# Patient Record
Sex: Male | Born: 1985 | Race: Black or African American | Hispanic: No | Marital: Single | Smoking: Never smoker
Health system: Southern US, Community
[De-identification: ages and names within clinical notes are randomized; demographics above are authoritative.]

## PROBLEM LIST (undated history)

## (undated) DIAGNOSIS — I10 Essential (primary) hypertension: Secondary | ICD-10-CM

---

## 2015-10-09 ENCOUNTER — Emergency Department (HOSPITAL_COMMUNITY): Payer: BLUE CROSS/BLUE SHIELD

## 2015-10-09 ENCOUNTER — Emergency Department (HOSPITAL_COMMUNITY)
Admission: EM | Admit: 2015-10-09 | Discharge: 2015-10-09 | Disposition: A | Discharge status: Home / Self Care | Payer: BLUE CROSS/BLUE SHIELD | Attending: Emergency Medicine | Admitting: Emergency Medicine

## 2015-10-09 ENCOUNTER — Encounter (HOSPITAL_COMMUNITY): Payer: Self-pay

## 2015-10-09 DIAGNOSIS — S76192A Other specified injury of left quadriceps muscle, fascia and tendon, initial encounter: Secondary | ICD-10-CM | POA: Diagnosis not present

## 2015-10-09 DIAGNOSIS — Y9344 Activity, trampolining: Secondary | ICD-10-CM | POA: Insufficient documentation

## 2015-10-09 DIAGNOSIS — S76112A Strain of left quadriceps muscle, fascia and tendon, initial encounter: Secondary | ICD-10-CM

## 2015-10-09 DIAGNOSIS — I10 Essential (primary) hypertension: Secondary | ICD-10-CM | POA: Diagnosis not present

## 2015-10-09 DIAGNOSIS — Y999 Unspecified external cause status: Secondary | ICD-10-CM | POA: Insufficient documentation

## 2015-10-09 DIAGNOSIS — Y9289 Other specified places as the place of occurrence of the external cause: Secondary | ICD-10-CM | POA: Diagnosis not present

## 2015-10-09 DIAGNOSIS — W16722A Jumping or diving from boat striking bottom causing other injury, initial encounter: Secondary | ICD-10-CM | POA: Insufficient documentation

## 2015-10-09 DIAGNOSIS — S8992XA Unspecified injury of left lower leg, initial encounter: Secondary | ICD-10-CM | POA: Diagnosis present

## 2015-10-09 HISTORY — DX: Essential (primary) hypertension: I10

## 2015-10-09 MED ORDER — OXYCODONE-ACETAMINOPHEN 5-325 MG PO TABS: 1.0000 | ORAL_TABLET | ORAL | Status: AC | PRN

## 2015-10-09 MED ORDER — OXYCODONE-ACETAMINOPHEN 5-325 MG PO TABS
1.0000 | ORAL_TABLET | Freq: Once | ORAL | Status: AC
  Administered 2015-10-09: 1 via ORAL
  Filled 2015-10-09: qty 1

## 2015-10-09 NOTE — ED Provider Notes (Signed)
CSN: [id number]     Arrival date & time 10/09/15  1830 History   By signing my name below, I, Renetta ChalkBobby Ross, attest that this documentation has been prepared under the direction and in the presence of Earley FavorGail Janyia Guion NP.  Electronically Signed: Renetta ChalkBobby Ross, ED Scribe. 09/12/2015. 4:01 PM.  Chief Complaint  Patient presents with  . Leg Pain  . Leg Injury   The history is provided by the patient. No language interpreter was used.   HPI Comments: HPI Comments: Brady KingfisherJames Fawbush is a 30 y.o. male brought in by ambulance, who presents to the Emergency Department complaining of gradually worsening, left knee pain s/p an injury to his leg that occurred this afternoon. Pt states he was jumping on a trampoline when he landed on the extremity and his knee buckled. Pt reports pain is exacerbated upon ambulation. Pt denies any Hx of knee problems  Past Medical History  Diagnosis Date  . HTN (hypertension)    History reviewed. No pertinent past surgical history. History reviewed. No pertinent family history. Social History  Substance Use Topics  . Smoking status: Never Smoker   . Smokeless tobacco: None  . Alcohol Use: No    Review of Systems  Musculoskeletal: Positive for arthralgias (Left knee).  Skin: Negative for wound.  All other systems reviewed and are negative.     Allergies  Review of patient's allergies indicates no known allergies.  Home Medications   Prior to Admission medications   Medication Sig Start Date End Date Taking? Authorizing Provider  oxyCODONE-acetaminophen (PERCOCET/ROXICET) 5-325 MG tablet Take 1 tablet by mouth every 4 (four) hours as needed for severe pain. 10/09/15   Earley FavorGail Gracious Renken, NP   BP 128/68 mmHg  Pulse 78  Temp(Src) 98.4 F (36.9 C) (Oral)  Resp 16  SpO2 98% Physical Exam  Constitutional: He is oriented to person, place, and time. He appears well-developed and well-nourished. No distress.  HENT:  Head: Normocephalic and atraumatic.  Neck: Normal range of  motion.  Pulmonary/Chest: Effort normal.  Musculoskeletal: He exhibits tenderness.  Divot on top of superior patella.  Neurological: He is alert and oriented to person, place, and time.  Skin: Skin is warm and dry. He is not diaphoretic.  Psychiatric: He has a normal mood and affect. Judgment normal.  Nursing note and vitals reviewed.   ED Course  Procedures  DIAGNOSTIC STUDIES: Oxygen Saturation is 98% on RA, normal by my interpretation.  COORDINATION OF CARE: 6:53 PM-Will order imaging. Discussed treatment plan with pt at bedside and pt agreed to plan.   Labs Review Labs Reviewed - No data to display  Imaging Review No results found. I have personally reviewed and evaluated these images and lab results as part of my medical decision-making.   EKG Interpretation None      MDM   Final diagnoses:  Quadriceps tendon rupture, left, initial encounter    I personally performed the services described in this documentation, which was scribed in my presence. The recorded information has been reviewed and is accurate.   Earley FavorGail Sindy Mccune, NP 10/27/15 [phone]  Lyndal Pulleyaniel Knott, MD 10/28/15 [id number]  Lyndal Pulleyaniel Knott, MD 10/28/15 [id number]

## 2015-10-09 NOTE — ED Notes (Signed)
According to EMS, pt injured left leg at trampoline park. Pt reports 7/10 pain of his left knee and left thigh. Pt states he heard a "pop" at the time of injury. Pt reports increased pain upon ambulation. No obvious deformity noted. Pt arrives A+OX4, speaking in complete sentences.   BP 128/78 P 84

## 2015-10-09 NOTE — Discharge Instructions (Signed)
Call Dr. Dion SaucierLandau on Monday to arrange an appointment and surgery date  Call first thing in the morning and make sure to state you were in the ED and I spoke with him.    Cryotherapy Cryotherapy is when you put ice on your injury. Ice helps lessen pain and puffiness (swelling) after an injury. Ice works the best when you start using it in the first 24 to 48 hours after an injury. HOME CARE  Put a dry or damp towel between the ice pack and your skin.  You may press gently on the ice pack.  Leave the ice on for no more than 10 to 20 minutes at a time.  Check your skin after 5 minutes to make sure your skin is okay.  Rest at least 20 minutes between ice pack uses.  Stop using ice when your skin loses feeling (numbness).  Do not use ice on someone who cannot tell you when it hurts. This includes small children and people with memory problems (dementia). GET HELP RIGHT AWAY IF:  You have white spots on your skin.  Your skin turns blue or pale.  Your skin feels waxy or hard.  Your puffiness gets worse. MAKE SURE YOU:   Understand these instructions.  Will watch your condition.  Will get help right away if you are not doing well or get worse.   This information is not intended to replace advice given to you by your health care provider. Make sure you discuss any questions you have with your health care provider.   Document Released: 09/20/2007 Document Revised: 06/26/2011 Document Reviewed: 11/24/2010 Elsevier Interactive Patient Education [employer]

## 2015-10-09 NOTE — ED Provider Notes (Signed)
Medical screening examination/treatment/procedure(s) were conducted as a shared visit with non-physician practitioner(s) and myself.  I personally evaluated the patient during the encounter.   EKG Interpretation None     30 y.o. male presents with left leg pain and floating patella, injury is prox to knee. Loss of extension mechanism. Plain films negative but evidence of quad rupture on bedside US. Will discus with ortho and Pt will obtain f/u for operative management.  Emergency Focused Ultrasound Exam Limited Ultrasound of Soft Tissue   Performed and interpreted by Dr. Clydene PughKnott Indication: evaluation for infection or foreign body Transverse and Sagittal views of left quadriceps are obtained in real time for the purposes of evaluation of skin and underlying soft tissues.  Findings: discontinuity of tendon with associated hematoma Interpretation: high grade rupture of quadriceps Images archived electronically.  CPT Codes:  Lower extremity [id number]  Other soft tissue [id number]    See related encounter note   Lyndal Pulleyaniel Yeila Morro, MD 10/28/15 [phone]

## 2017-02-15 IMAGING — CR DG KNEE COMPLETE 4+V*L*
4 series · 4 of 4 positions shown · non-contrast
Comparison: None.

CLINICAL DATA: Pain after trauma

EXAM:
LEFT KNEE - COMPLETE 4+ VIEW

[t knee ap left]
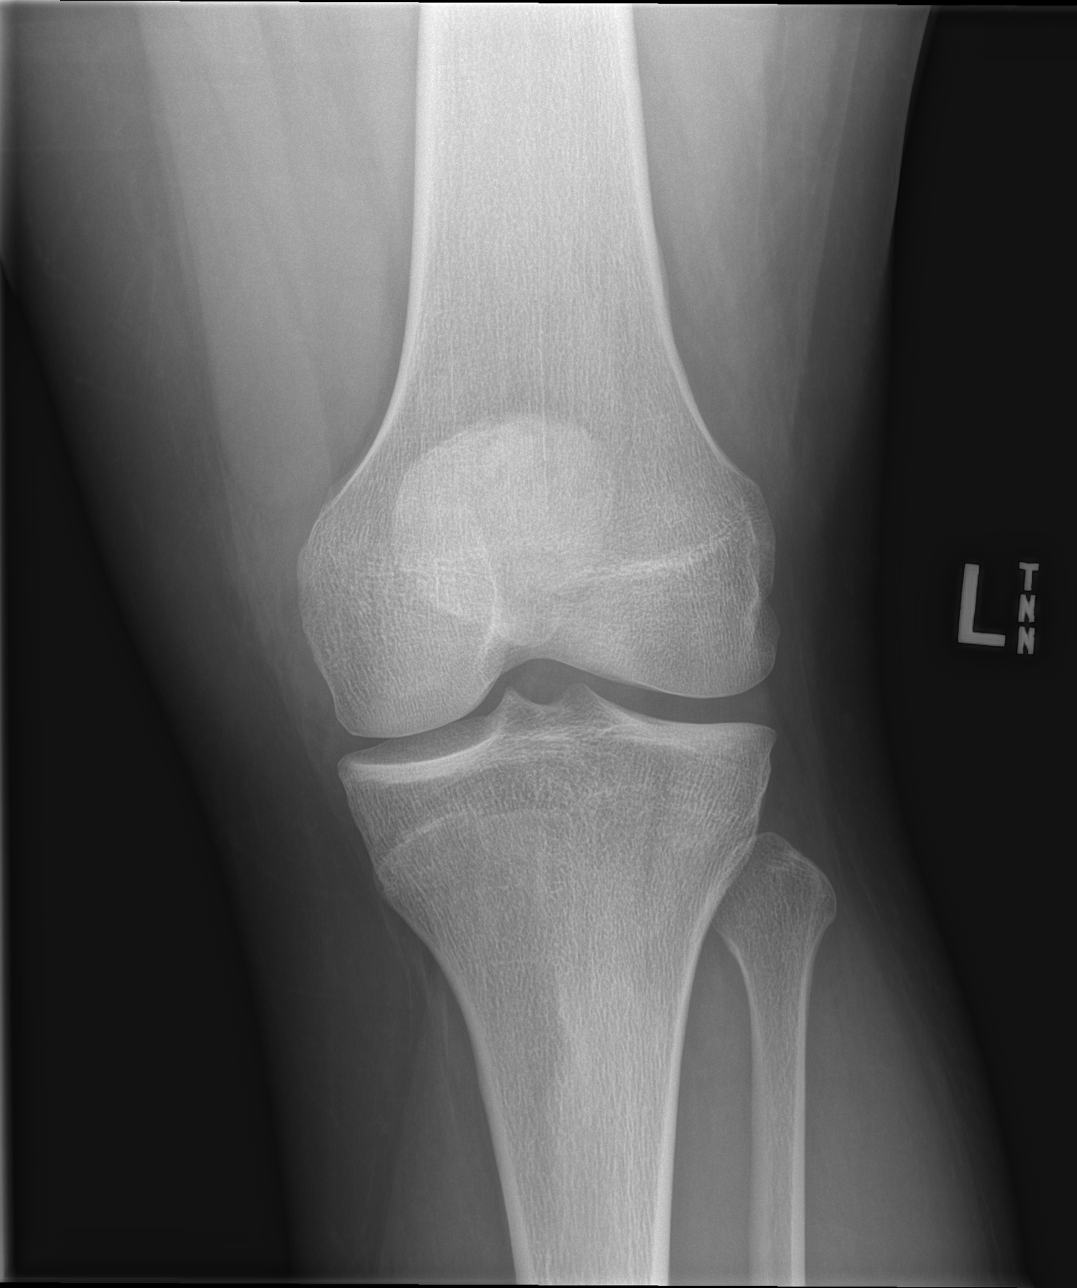

[t knee obl left (1 of 2)]
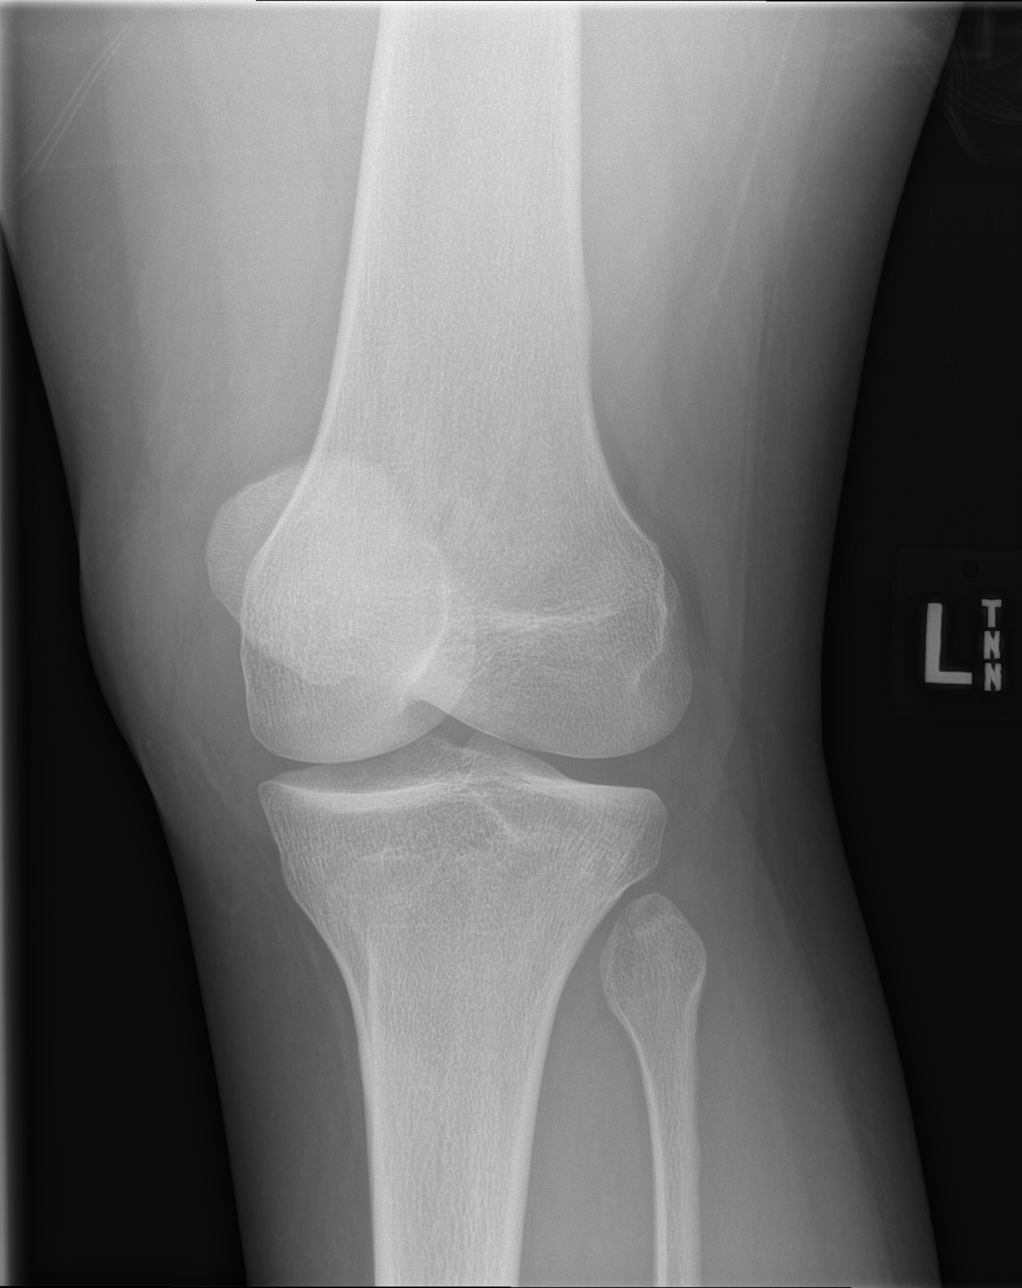

[t knee obl left (2 of 2)]
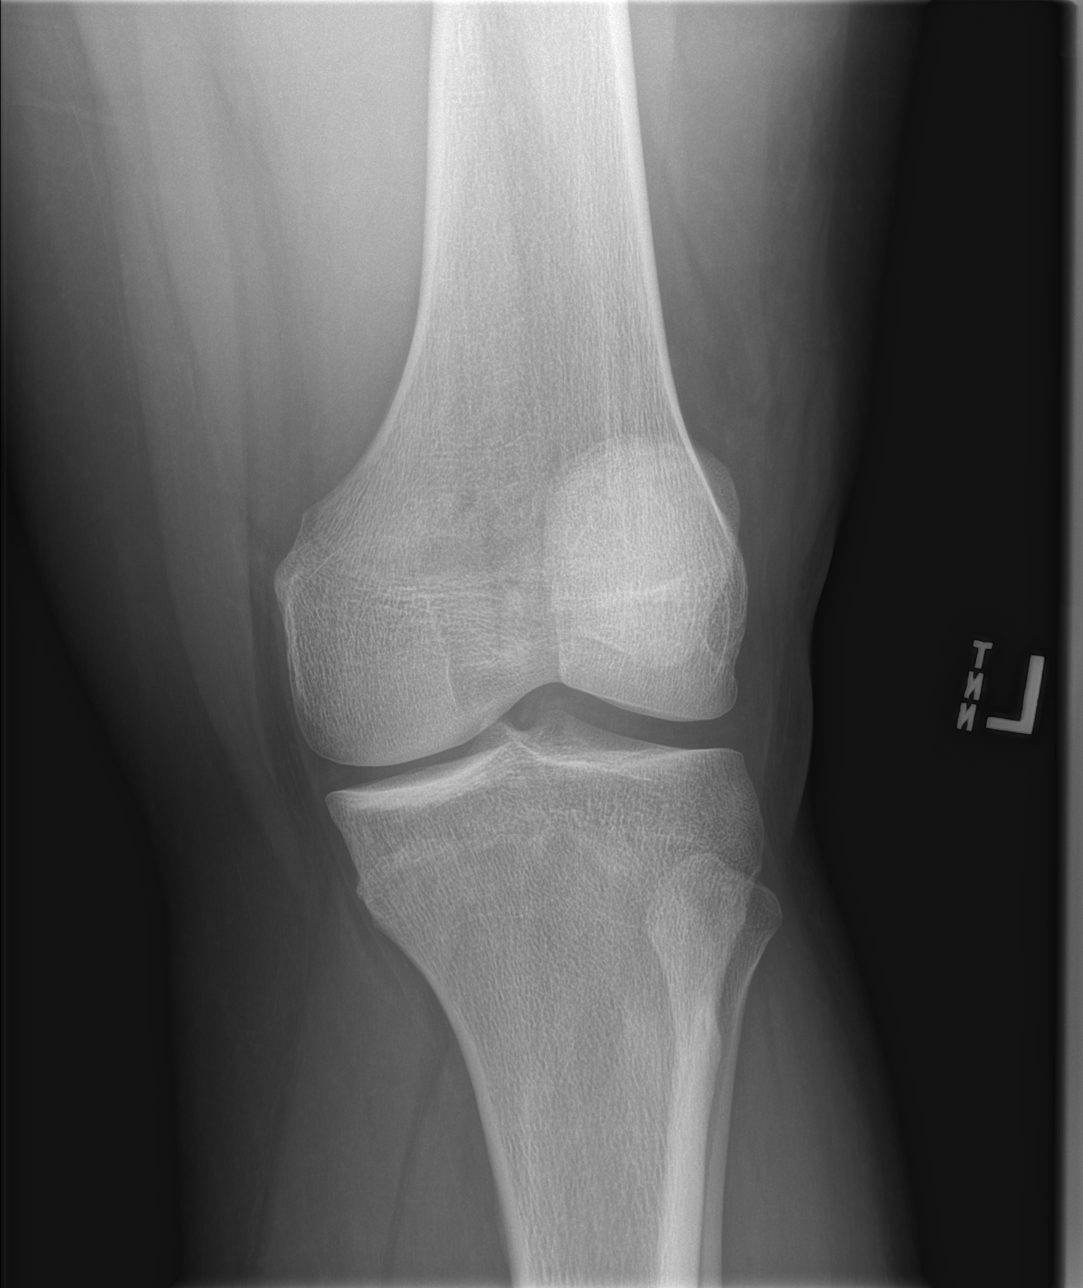

[t knee lat left]
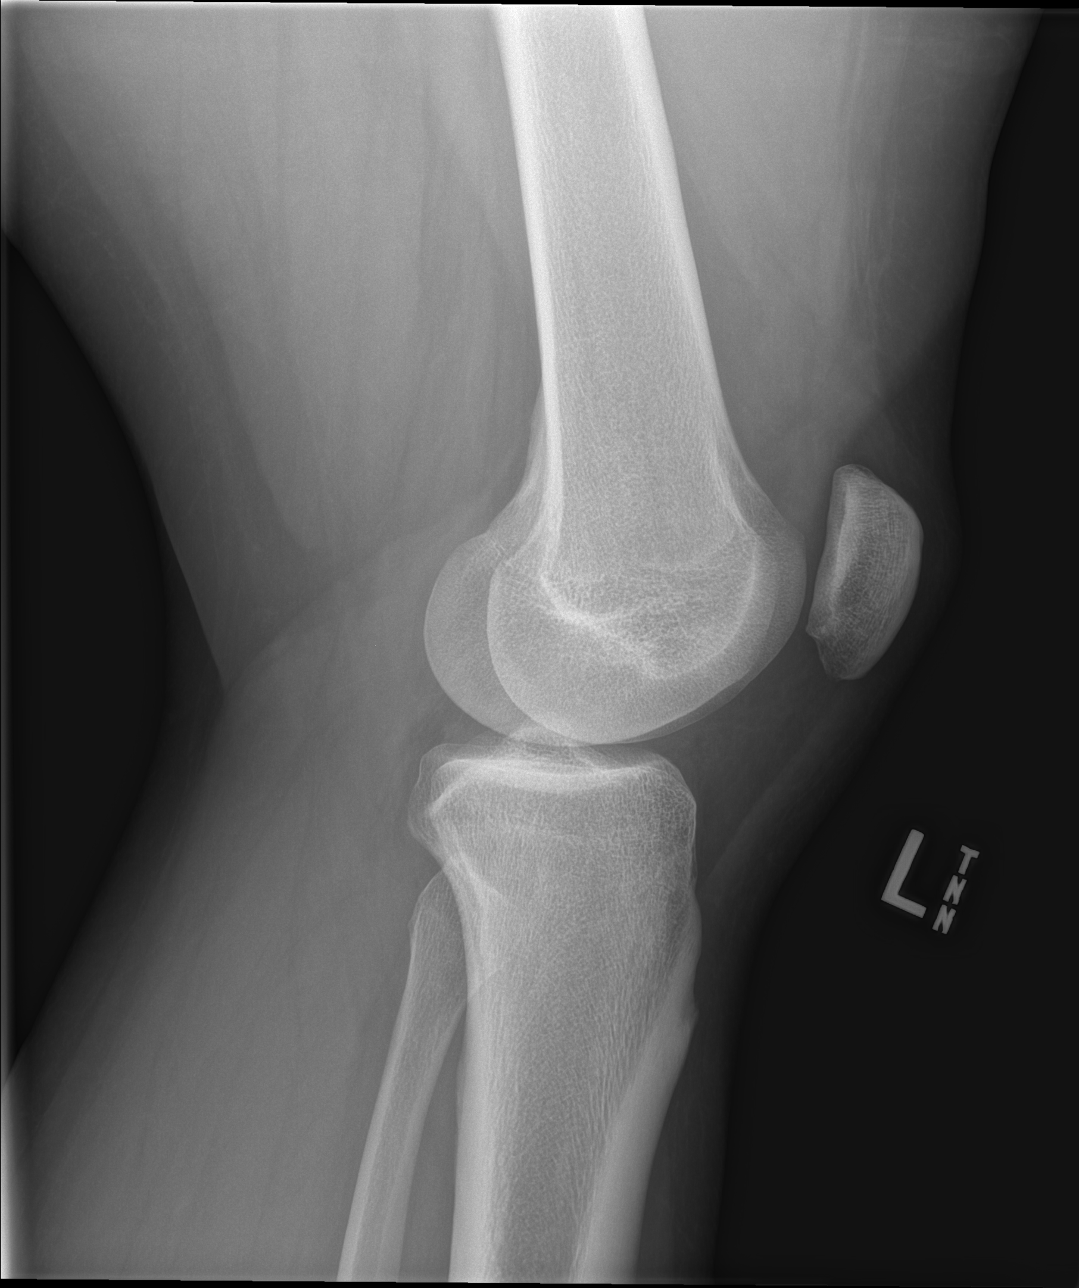

[4 of 4 positions shown; findings below may reference images not displayed]

FINDINGS: No fracture or dislocation.  Suspected small joint effusion.
IMPRESSION: Suspected small joint effusion with no fracture or dislocation.
